# Patient Record
Sex: Male | Born: 1985 | Race: White | Hispanic: No | Marital: Married | Smoking: Never smoker
Health system: Southern US, Community
[De-identification: ages and names within clinical notes are randomized; demographics above are authoritative.]

## PROBLEM LIST (undated history)

## (undated) HISTORY — PX: NO PAST SURGERIES: SHX2092

---

## 2010-11-06 ENCOUNTER — Emergency Department: Payer: Self-pay | Admitting: Emergency Medicine

## 2010-12-27 ENCOUNTER — Emergency Department: Payer: Self-pay | Admitting: *Deleted

## 2015-02-18 ENCOUNTER — Encounter: Payer: Self-pay | Admitting: *Deleted

## 2015-02-18 ENCOUNTER — Ambulatory Visit
Admission: EM | Admit: 2015-02-18 | Discharge: 2015-02-18 | Disposition: A | Discharge status: Home / Self Care | Payer: BLUE CROSS/BLUE SHIELD | Attending: Family Medicine | Admitting: Family Medicine

## 2015-02-18 DIAGNOSIS — J0111 Acute recurrent frontal sinusitis: Secondary | ICD-10-CM

## 2015-02-18 DIAGNOSIS — B349 Viral infection, unspecified: Secondary | ICD-10-CM

## 2015-02-18 DIAGNOSIS — H6593 Unspecified nonsuppurative otitis media, bilateral: Secondary | ICD-10-CM | POA: Diagnosis not present

## 2015-02-18 LAB — RAPID INFLUENZA A&B ANTIGENS
Influenza A (ARMC): NOT DETECTED
Influenza B (ARMC): NOT DETECTED

## 2015-02-18 LAB — RAPID STREP SCREEN (MED CTR MEBANE ONLY): Streptococcus, Group A Screen (Direct): NEGATIVE

## 2015-02-18 MED ORDER — BENZONATATE 200 MG PO CAPS: 200.0000 mg | ORAL_CAPSULE | Freq: Three times a day (TID) | ORAL | Status: AC | PRN

## 2015-02-18 MED ORDER — IBUPROFEN 800 MG PO TABS: 800.0000 mg | ORAL_TABLET | Freq: Three times a day (TID) | ORAL | Status: AC

## 2015-02-18 MED ORDER — AMOXICILLIN-POT CLAVULANATE 875-125 MG PO TABS: 1.0000 | ORAL_TABLET | Freq: Two times a day (BID) | ORAL | Status: AC

## 2015-02-18 MED ORDER — ACETAMINOPHEN 500 MG PO TABS: 1000.0000 mg | ORAL_TABLET | Freq: Four times a day (QID) | ORAL | Status: AC | PRN

## 2015-02-18 MED ORDER — FLUTICASONE PROPIONATE 50 MCG/ACT NA SUSP: 1.0000 | Freq: Two times a day (BID) | NASAL | Status: AC

## 2015-02-18 MED ORDER — SALINE SPRAY 0.65 % NA SOLN: 2.0000 | NASAL | Status: AC

## 2015-02-18 NOTE — ED Notes (Signed)
Starting feeling bad yesterday, body aches, fever, cold and hot, went to work, had to leave early.  Cough, congestion, headache, body aches and fever.  Flu and strep pending.  States does have a history of strep throat.

## 2015-02-18 NOTE — Discharge Instructions (Signed)
Otitis Media With Effusion Otitis media with effusion is the presence of fluid in the middle ear. This is a common problem in children, which often follows ear infections. It may be present for weeks or longer after the infection. Unlike an acute ear infection, otitis media with effusion refers only to fluid behind the ear drum and not infection. Children with repeated ear and sinus infections and allergy problems are the most likely to get otitis media with effusion. CAUSES  The most frequent cause of the fluid buildup is dysfunction of the eustachian tubes. These are the tubes that drain fluid in the ears to the back of the nose (nasopharynx). SYMPTOMS   The main symptom of this condition is hearing loss. As a result, you or your child may:  Listen to the TV at a loud volume.  Not respond to questions.  Ask "what" often when spoken to.  Mistake or confuse one sound or word for another.  There may be a sensation of fullness or pressure but usually not pain. DIAGNOSIS   Your health care provider will diagnose this condition by examining you or your child's ears.  Your health care provider may test the pressure in you or your child's ear with a tympanometer.  A hearing test may be conducted if the problem persists. TREATMENT   Treatment depends on the duration and the effects of the effusion.  Antibiotics, decongestants, nose drops, and cortisone-type drugs (tablets or nasal spray) may not be helpful.  Children with persistent ear effusions may have delayed language or behavioral problems. Children at risk for developmental delays in hearing, learning, and speech may require referral to a specialist earlier than children not at risk.  You or your child's health care provider may suggest a referral to an ear, nose, and throat surgeon for treatment. The following may help restore normal hearing:  Drainage of fluid.  Placement of ear tubes (tympanostomy tubes).  Removal of  adenoids (adenoidectomy). HOME CARE INSTRUCTIONS   Avoid secondhand smoke.  Infants who are breastfed are less likely to have this condition.  Avoid feeding infants while they are lying flat.  Avoid known environmental allergens.  Avoid people who are sick. SEEK MEDICAL CARE IF:   Hearing is not better in 3 months.  Hearing is worse.  Ear pain.  Drainage from the ear.  Dizziness. MAKE SURE YOU:   Understand these instructions.  Will watch your condition.  Will get help right away if you are not doing well or get worse.   This information is not intended to replace advice given to you by your health care provider. Make sure you discuss any questions you have with your health care provider.   Document Released: 05/01/2004 Document Revised: 04/14/2014 Document Reviewed: 10/19/2012 Elsevier Interactive Patient Education 2016 [employer] Sinusitis, Adult Sinusitis is redness, soreness, and inflammation of the paranasal sinuses. Paranasal sinuses are air pockets within the bones of your face. They are located beneath your eyes, in the middle of your forehead, and above your eyes. In healthy paranasal sinuses, mucus is able to drain out, and air is able to circulate through them by way of your nose. However, when your paranasal sinuses are inflamed, mucus and air can become trapped. This can allow bacteria and other germs to grow and cause infection. Sinusitis can develop quickly and last only a short time (acute) or continue over a long period (chronic). Sinusitis that lasts for more than 12 weeks is considered chronic. CAUSES Causes of sinusitis  include:  Allergies.  Structural abnormalities, such as displacement of the cartilage that separates your nostrils (deviated septum), which can decrease the air flow through your nose and sinuses and affect sinus drainage.  Functional abnormalities, such as when the small hairs (cilia) that line your sinuses and help remove mucus do  not work properly or are not present. SIGNS AND SYMPTOMS Symptoms of acute and chronic sinusitis are the same. The primary symptoms are pain and pressure around the affected sinuses. Other symptoms include:  Upper toothache.  Earache.  Headache.  Bad breath.  Decreased sense of smell and taste.  A cough, which worsens when you are lying flat.  Fatigue.  Fever.  Thick drainage from your nose, which often is green and may contain pus (purulent).  Swelling and warmth over the affected sinuses. DIAGNOSIS Your health care provider will perform a physical exam. During your exam, your health care provider may perform any of the following to help determine if you have acute sinusitis or chronic sinusitis:  Look in your nose for signs of abnormal growths in your nostrils (nasal polyps).  Tap over the affected sinus to check for signs of infection.  View the inside of your sinuses using an imaging device that has a light attached (endoscope). If your health care provider suspects that you have chronic sinusitis, one or more of the following tests may be recommended:  Allergy tests.  Nasal culture. A sample of mucus is taken from your nose, sent to a lab, and screened for bacteria.  Nasal cytology. A sample of mucus is taken from your nose and examined by your health care provider to determine if your sinusitis is related to an allergy. TREATMENT Most cases of acute sinusitis are related to a viral infection and will resolve on their own within 10 days. Sometimes, medicines are prescribed to help relieve symptoms of both acute and chronic sinusitis. These may include pain medicines, decongestants, nasal steroid sprays, or saline sprays. However, for sinusitis related to a bacterial infection, your health care provider will prescribe antibiotic medicines. These are medicines that will help kill the bacteria causing the infection. Rarely, sinusitis is caused by a fungal infection. In  these cases, your health care provider will prescribe antifungal medicine. For some cases of chronic sinusitis, surgery is needed. Generally, these are cases in which sinusitis recurs more than 3 times per year, despite other treatments. HOME CARE INSTRUCTIONS  Drink plenty of water. Water helps thin the mucus so your sinuses can drain more easily.  Use a humidifier.  Inhale steam 3-4 times a day (for example, sit in the bathroom with the shower running).  Apply a warm, moist washcloth to your face 3-4 times a day, or as directed by your health care provider.  Use saline nasal sprays to help moisten and clean your sinuses.  Take medicines only as directed by your health care provider.  If you were prescribed either an antibiotic or antifungal medicine, finish it all even if you start to feel better. SEEK IMMEDIATE MEDICAL CARE IF:  You have increasing pain or severe headaches.  You have nausea, vomiting, or drowsiness.  You have swelling around your face.  You have vision problems.  You have a stiff neck.  You have difficulty breathing.   This information is not intended to replace advice given to you by your health care provider. Make sure you discuss any questions you have with your health care provider.   Document Released: 03/24/2005 Document Revised: 04/14/2014  Document Reviewed: 04/08/2011 Elsevier Interactive Patient Education 2016 [employer]. Viral Gastroenteritis Viral gastroenteritis is also known as stomach flu. This condition affects the stomach and intestinal tract. It can cause sudden diarrhea and vomiting. The illness typically lasts 3 to 8 days. Most people develop an immune response that eventually gets rid of the virus. While this natural response develops, the virus can make you quite ill. CAUSES  Many different viruses can cause gastroenteritis, such as rotavirus or noroviruses. You can catch one of these viruses by consuming contaminated food or water.  You may also catch a virus by sharing utensils or other personal items with an infected person or by touching a contaminated surface. SYMPTOMS  The most common symptoms are diarrhea and vomiting. These problems can cause a severe loss of body fluids (dehydration) and a body salt (electrolyte) imbalance. Other symptoms may include:  Fever.  Headache.  Fatigue.  Abdominal pain. DIAGNOSIS  Your caregiver can usually diagnose viral gastroenteritis based on your symptoms and a physical exam. A stool sample may also be taken to test for the presence of viruses or other infections. TREATMENT  This illness typically goes away on its own. Treatments are aimed at rehydration. The most serious cases of viral gastroenteritis involve vomiting so severely that you are not able to keep fluids down. In these cases, fluids must be given through an intravenous line (IV). HOME CARE INSTRUCTIONS   Drink enough fluids to keep your urine clear or pale yellow. Drink small amounts of fluids frequently and increase the amounts as tolerated.  Ask your caregiver for specific rehydration instructions.  Avoid:  Foods high in sugar.  Alcohol.  Carbonated drinks.  Tobacco.  Juice.  Caffeine drinks.  Extremely hot or cold fluids.  Fatty, greasy foods.  Too much intake of anything at one time.  Dairy products until 24 to 48 hours after diarrhea stops.  You may consume probiotics. Probiotics are active cultures of beneficial bacteria. They may lessen the amount and number of diarrheal stools in adults. Probiotics can be found in yogurt with active cultures and in supplements.  Wash your hands well to avoid spreading the virus.  Only take over-the-counter or prescription medicines for pain, discomfort, or fever as directed by your caregiver. Do not give aspirin to children. Antidiarrheal medicines are not recommended.  Ask your caregiver if you should continue to take your regular prescribed and  over-the-counter medicines.  Keep all follow-up appointments as directed by your caregiver. SEEK IMMEDIATE MEDICAL CARE IF:   You are unable to keep fluids down.  You do not urinate at least once every 6 to 8 hours.  You develop shortness of breath.  You notice blood in your stool or vomit. This may look like coffee grounds.  You have abdominal pain that increases or is concentrated in one small area (localized).  You have persistent vomiting or diarrhea.  You have a fever.  The patient is a child younger than 3 months, and he or she has a fever.  The patient is a child older than 3 months, and he or she has a fever and persistent symptoms.  The patient is a child older than 3 months, and he or she has a fever and symptoms suddenly get worse.  The patient is a baby, and he or she has no tears when crying. MAKE SURE YOU:   Understand these instructions.  Will watch your condition.  Will get help right away if you are not doing well or  get worse.   This information is not intended to replace advice given to you by your health care provider. Make sure you discuss any questions you have with your health care provider.   Document Released: 03/24/2005 Document Revised: 06/16/2011 Document Reviewed: 01/08/2011 Elsevier Interactive Patient Education 2016 [employer]. Viral Infections A virus is a type of germ. Viruses can cause:  Minor sore throats.  Aches and pains.  Headaches.  Runny nose.  Rashes.  Watery eyes.  Tiredness.  Coughs.  Loss of appetite.  Feeling sick to your stomach (nausea).  Throwing up (vomiting).  Watery poop (diarrhea). HOME CARE   Only take medicines as told by your doctor.  Drink enough water and fluids to keep your pee (urine) clear or pale yellow. Sports drinks are a good choice.  Get plenty of rest and eat healthy. Soups and broths with crackers or rice are fine. GET HELP RIGHT AWAY IF:   You have a very bad  headache.  You have shortness of breath.  You have chest pain or neck pain.  You have an unusual rash.  You cannot stop throwing up.  You have watery poop that does not stop.  You cannot keep fluids down.  You or your child has a temperature by mouth above 102 F (38.9 C), not controlled by medicine.  Your baby is older than 3 months with a rectal temperature of 102 F (38.9 C) or higher.  Your baby is 283 months old or younger with a rectal temperature of 100.4 F (38 C) or higher. MAKE SURE YOU:   Understand these instructions.  Will watch this condition.  Will get help right away if you are not doing well or get worse.   This information is not intended to replace advice given to you by your health care provider. Make sure you discuss any questions you have with your health care provider.   Document Released: 03/06/2008 Document Revised: 06/16/2011 Document Reviewed: 08/30/2014 Elsevier Interactive Patient Education [employer]2016 [employer]

## 2015-02-19 NOTE — ED Provider Notes (Addendum)
CSN: [id number]     Arrival date & time 02/18/15  1454 History   First MD Initiated Contact with Patient 02/18/15 1559     Chief Complaint  Patient presents with  . Influenza   (Consider location/radiation/quality/duration/timing/severity/associated sxs/prior Treatment) HPI Comments: Single caucasian male works at [employer] coworkers sick and diagnosed with flu.  Started yesterday with green cough productive, right frontal headache, sore throat, soft serve loose stool, fatigue, post nasal drip, sinus congestion  "I get this every couple of years goes straight to my chest pneumonia"  Patient is a 29 y.o. male presenting with flu symptoms. The history is provided by the patient.  Influenza Presenting symptoms: cough, diarrhea, fatigue, fever, headache, myalgias, rhinorrhea and sore throat   Presenting symptoms: no nausea, no shortness of breath and no vomiting   Cough:    Cough characteristics:  Productive   Sputum characteristics:  Green   Severity:  Moderate   Onset quality:  Sudden   Duration:  1 day   Timing:  Constant   Progression:  Unchanged   Chronicity:  New Diarrhea:    Quality:  Semi-solid   Number of occurrences:  2   Severity:  Mild   Duration:  1 day   Timing:  Intermittent   Progression:  Improving Fatigue:    Severity:  Severe   Duration:  1 day   Timing:  Constant   Progression:  Unchanged Fever:    Duration:  1 day   Timing:  Constant   Temp source:  Subjective Headaches:    Severity:  Severe   Onset quality:  Sudden   Duration:  1 day   Timing:  Constant   Progression:  Unchanged   Chronicity:  New Myalgias:    Location:  Generalized   Quality:  Aching   Severity:  Moderate   Onset quality:  Sudden   Duration:  1 day   Timing:  Constant   Progression:  Unchanged Rhinorrhea:    Quality:  Green   Severity:  Mild   Duration:  1 day   Timing:  Intermittent   Progression:  Unchanged Sore throat:    Severity:  Severe   Onset quality:  Sudden    Duration:  1 day   Timing:  Constant   Progression:  Unchanged Severity:  Moderate Onset quality:  Sudden Duration:  1 day Progression:  Worsening Chronicity:  New Relieved by:  Nothing Worsened by:  Movement Ineffective treatments:  Rest, hot fluids, drinking and certain positions Associated symptoms: chills, decreased appetite, decreased physical activity and nasal congestion   Associated symptoms: no ear pain, no mental status change, no neck stiffness and no witnessed syncope   Risk factors: not elderly, no diabetes problem, no heart disease, no kidney disease and no liver disease     History reviewed. No pertinent past medical history. History reviewed. No pertinent past surgical history. History reviewed. No pertinent family history. Social History  Substance Use Topics  . Smoking status: None  . Smokeless tobacco: None  . Alcohol Use: None    Review of Systems  Constitutional: Positive for fever, chills, activity change, appetite change, fatigue and decreased appetite. Negative for diaphoresis and unexpected weight change.  HENT: Positive for congestion, postnasal drip, rhinorrhea, sinus pressure and sore throat. Negative for dental problem, drooling, ear discharge, ear pain, facial swelling, hearing loss, mouth sores, nosebleeds, sneezing, tinnitus, trouble swallowing and voice change.   Eyes: Negative for photophobia, pain, discharge, redness, itching and visual disturbance.  Respiratory: Positive for cough. Negative for choking, chest tightness, shortness of breath, wheezing and stridor.   Cardiovascular: Negative for chest pain, palpitations and leg swelling.  Gastrointestinal: Positive for diarrhea. Negative for nausea, vomiting, abdominal pain, constipation, blood in stool and abdominal distention.  Endocrine: Negative for cold intolerance and heat intolerance.  Genitourinary: Negative for dysuria.  Musculoskeletal: Positive for myalgias. Negative for back pain, joint  swelling, arthralgias, gait problem, neck pain and neck stiffness.  Skin: Negative for color change, pallor, rash and wound.  Allergic/Immunologic: Positive for environmental allergies. Negative for food allergies and immunocompromised state.  Neurological: Positive for headaches. Negative for dizziness, tremors, seizures, syncope, facial asymmetry, speech difficulty, weakness, light-headedness and numbness.  Hematological: Negative for adenopathy. Does not bruise/bleed easily.  Psychiatric/Behavioral: Positive for sleep disturbance. Negative for behavioral problems, confusion and agitation.    Allergies  Review of patient's allergies indicates no known allergies.  Home Medications   Prior to Admission medications   Medication Sig Start Date End Date Taking? Authorizing Provider  acetaminophen (TYLENOL) 500 MG tablet Take 2 tablets (1,000 mg total) by mouth every 6 (six) hours as needed for mild pain, moderate pain, fever or headache. 02/18/15   Barbaraann Barthel, NP  amoxicillin-clavulanate (AUGMENTIN) 875-125 MG tablet Take 1 tablet by mouth every 12 (twelve) hours. 02/18/15   Barbaraann Barthel, NP  benzonatate (TESSALON) 200 MG capsule Take 1 capsule (200 mg total) by mouth 3 (three) times daily as needed for cough. 02/18/15 02/25/15  Barbaraann Barthel, NP  fluticasone (FLONASE) 50 MCG/ACT nasal spray Place 1 spray into both nostrils 2 (two) times daily. 02/18/15   Barbaraann Barthel, NP  ibuprofen (ADVIL,MOTRIN) 800 MG tablet Take 1 tablet (800 mg total) by mouth 3 (three) times daily. 02/18/15   Barbaraann Barthel, NP  sodium chloride (OCEAN) 0.65 % SOLN nasal spray Place 2 sprays into both nostrils every 2 (two) hours while awake. 02/18/15   Barbaraann Barthel, NP   Meds Ordered and Administered this Visit  Medications - No data to display  BP 130/78 mmHg  Pulse 86  Temp(Src) 98.9 F (37.2 C) (Oral)  Resp 20  Ht 5\' 11"  (1.803 m)  Wt 200 lb (90.719 kg)  BMI 27.91 kg/m2  SpO2  100% No data found.   Physical Exam  Constitutional: He is oriented to person, place, and time. Vital signs are normal. He appears well-developed and well-nourished. He is active and cooperative.  Non-toxic appearance. He does not have a sickly appearance. He appears ill. No distress.  HENT:  Head: Normocephalic and atraumatic.  Right Ear: Hearing, external ear and ear canal normal. A middle ear effusion is present.  Left Ear: Hearing, external ear and ear canal normal. A middle ear effusion is present.  Nose: Mucosal edema and rhinorrhea present. No nose lacerations, sinus tenderness, nasal deformity, septal deviation or nasal septal hematoma. No epistaxis.  No foreign bodies. Right sinus exhibits frontal sinus tenderness. Right sinus exhibits no maxillary sinus tenderness. Left sinus exhibits no maxillary sinus tenderness and no frontal sinus tenderness.  Mouth/Throat: Uvula is midline and mucous membranes are normal. Mucous membranes are not pale, not dry and not cyanotic. He does not have dentures. No oral lesions. No trismus in the jaw. Normal dentition. No dental abscesses, uvula swelling, lacerations or dental caries. Posterior oropharyngeal edema and posterior oropharyngeal erythema present. No oropharyngeal exudate or tonsillar abscesses.  Cobblestoning posterior pharynx; bilateral tonsils with edema/erythema 2+/4 bilateral TMs with air fluid level clear  occasional nonproductive cough; nasal turbinates with edema erythema yellow discharge  Eyes: Conjunctivae, EOM and lids are normal. Pupils are equal, round, and reactive to light. Right eye exhibits no chemosis, no discharge, no exudate and no hordeolum. No foreign body present in the right eye. Left eye exhibits no chemosis, no discharge, no exudate and no hordeolum. No foreign body present in the left eye. Right conjunctiva is not injected. Right conjunctiva has no hemorrhage. Left conjunctiva is not injected. Left conjunctiva has no  hemorrhage. No scleral icterus. Right eye exhibits normal extraocular motion and no nystagmus. Left eye exhibits normal extraocular motion and no nystagmus. Right pupil is round and reactive. Left pupil is round and reactive. Pupils are equal.  Neck: Trachea normal and normal range of motion. Neck supple. No tracheal tenderness, no spinous process tenderness and no muscular tenderness present. No rigidity. No tracheal deviation, no edema, no erythema and normal range of motion present. No thyroid mass and no thyromegaly present.  Cardiovascular: Normal rate, regular rhythm, S1 normal, S2 normal, normal heart sounds and intact distal pulses.  PMI is not displaced.  Exam reveals no gallop and no friction rub.   No murmur heard. Pulses:      Radial pulses are 2+ on the right side, and 2+ on the left side.  Pulmonary/Chest: Effort normal and breath sounds normal. No accessory muscle usage or stridor. No respiratory distress. He has no decreased breath sounds. He has no wheezes. He has no rhonchi. He has no rales. He exhibits no tenderness.  egophany negative all lung fields  Abdominal: Soft. Bowel sounds are normal. He exhibits no shifting dullness, no distension, no pulsatile liver, no fluid wave, no abdominal bruit, no ascites, no pulsatile midline mass and no mass. There is no hepatosplenomegaly. There is no tenderness. There is no rigidity, no rebound, no guarding, no CVA tenderness, no tenderness at McBurney's point and negative Murphy's sign. Hernia confirmed negative in the ventral area.  Dull to percussion x 4 quads  Musculoskeletal: Normal range of motion. He exhibits no edema or tenderness.       Right shoulder: Normal.       Left shoulder: Normal.       Right hip: Normal.       Left hip: Normal.       Right knee: Normal.       Left knee: Normal.       Cervical back: Normal.       Thoracic back: Normal.       Lumbar back: Normal.       Right hand: Normal.       Left hand: Normal.   Lymphadenopathy:       Head (right side): No submental, no submandibular, no tonsillar, no preauricular, no posterior auricular and no occipital adenopathy present.       Head (left side): No submental, no submandibular, no tonsillar, no preauricular, no posterior auricular and no occipital adenopathy present.    He has no cervical adenopathy.       Right cervical: No superficial cervical, no deep cervical and no posterior cervical adenopathy present.      Left cervical: No superficial cervical, no deep cervical and no posterior cervical adenopathy present.  Neurological: He is alert and oriented to person, place, and time. He displays no atrophy and no tremor. No cranial nerve deficit or sensory deficit. He exhibits normal muscle tone. He displays no seizure activity. Coordination and gait normal. GCS eye subscore is 4. GCS  verbal subscore is 5. GCS motor subscore is 6.  Skin: Skin is warm, dry and intact. No abrasion, no bruising, no burn, no ecchymosis, no laceration, no lesion, no petechiae and no rash noted. He is not diaphoretic. No cyanosis or erythema. No pallor. Nails show no clubbing.  Psychiatric: He has a normal mood and affect. His speech is normal and behavior is normal. Judgment and thought content normal. Cognition and memory are normal.  Nursing note and vitals reviewed.   ED Course  Procedures (including critical care time)  Labs Review Labs Reviewed  RAPID INFLUENZA A&B ANTIGENS ([facility] ONLY)  RAPID STREP SCREEN (NOT AT [facility])  CULTURE, GROUP A STREP ([facility] ONLY)    Imaging Review No results found.  1630 patient notified rapid flu and strep negative.  Throat culture typically available 48 hours will call once results available. Tolerated cup of water without difficulty.  Patient verbalized understanding of information/instructions, agreed with plan of care and had no further questions at this time.  MDM   1. Viral illness   2. Otitis media with effusion, bilateral   3.  Acute recurrent frontal sinusitis    Supportive treatment.   No evidence of invasive bacterial infection, non toxic and well hydrated.  This is most likely self limiting viral infection.  I do not see where any further testing or imaging is necessary at this time.   I will suggest supportive care, rest, good hygiene and encourage the patient to take adequate fluids.  The patient is to return to clinic or EMERGENCY ROOM if symptoms worsen or change significantly e.g. ear pain, fever, purulent discharge from ears or bleeding.  Exitcare handout on otitis media with effusion given to patient.  Patient verbalized agreement and understanding of treatment plan.    Patient notified rapid strep negative.  Work excuse x 72 hours.  Suspect Viral illness: no evidence of invasive bacterial infection, non toxic and well hydrated.  This is most likely self limiting viral infection.  I do not see where any further testing or imaging is necessary at this time.   I will suggest supportive care, rest, good hygiene and encourage the patient to take adequate fluids.  Does not require work excuse.  Notified patient staff will call with culture results once available next 48+ hours.  nasal saline 1-2 sprays each nostril prn q2h, motrin 800mg  po TID prn.  Tessalon pearles 200mg  po TID prn cough.  Discussed honey with lemon and salt water gargles for comfort also.  The patient is to return to clinic or EMERGENCY ROOM if symptoms worsen or change significantly e.g. fever, lethargy, SOB, wheezing.  Exitcare handout on viral illness given to patient.  Patient verbalized agreement and understanding of treatment plan.    flonase 1 spray each nostril BId and saline 2 sprays each nostril q2h while awake.  Motrin 800mg  po TID prn pain.  If no relief 48-72 hours with flonase start augmentin 875mg  po BID x 10 days.  May also use tylenol 1000mg  po QID prn.  No evidence of systemic bacterial infection, non toxic and well hydrated.  I do not see  where any further testing or imaging is necessary at this time.   I will suggest supportive care, rest, good hygiene and encourage the patient to take adequate fluids.  The patient is to return to clinic or EMERGENCY ROOM if symptoms worsen or change significantly.  Exitcare handout on sinusitis given to patient.  Patient verbalized agreement and understanding of treatment plan  and had no further questions at this time.   P2:  Hand washing and cover cough   Barbaraann Barthelina A Lizbett Garciagarcia, NP 02/19/15 1353  20 Feb 2015 patient contacted clinic stated medicine not helping his cough.  Reviewed [place] Controlled substances website 1 Rx noted in past year  [phone]  Patient requesting refill of controlled cough medicine as worked well for him last time.  Discussed with patient he would need to pick up Rx from clinic front desk prior to closing at 2000 today.  Patient also notified throat culture normal/negative for atypical strep.  Patient verbalized understanding of information/instructions, agreed with plan of care and had no further questions at this time. 24/2016 04/30/2014 HYDROCODONE- CHLORPHEN ER SUSP [id number] [phone] [id number] PURSEL BRANDI B D.[place]. [place], [place] [id number] [facility] [place], [place] Bud[place], Mc M 12-May-1985 [address] [place], [place] [id number] 04 0   Barbaraann Barthelina A Ozzy Bohlken, NP 02/20/15 1131

## 2015-02-20 LAB — CULTURE, GROUP A STREP (THRC)

## 2015-02-20 MED ORDER — HYDROCOD POLST-CPM POLST ER 10-8 MG/5ML PO SUER: 5.0000 mL | Freq: Every evening | ORAL | Status: AC | PRN

## 2015-08-22 ENCOUNTER — Emergency Department: Payer: BLUE CROSS/BLUE SHIELD

## 2015-08-22 ENCOUNTER — Emergency Department
Admission: EM | Admit: 2015-08-22 | Discharge: 2015-08-22 | Disposition: A | Discharge status: Home / Self Care | Payer: BLUE CROSS/BLUE SHIELD | Attending: Student | Admitting: Student

## 2015-08-22 ENCOUNTER — Encounter: Payer: Self-pay | Admitting: Urgent Care

## 2015-08-22 DIAGNOSIS — Y9364 Activity, baseball: Secondary | ICD-10-CM | POA: Insufficient documentation

## 2015-08-22 DIAGNOSIS — Y929 Unspecified place or not applicable: Secondary | ICD-10-CM | POA: Insufficient documentation

## 2015-08-22 DIAGNOSIS — W010XXA Fall on same level from slipping, tripping and stumbling without subsequent striking against object, initial encounter: Secondary | ICD-10-CM | POA: Insufficient documentation

## 2015-08-22 DIAGNOSIS — Y999 Unspecified external cause status: Secondary | ICD-10-CM | POA: Insufficient documentation

## 2015-08-22 DIAGNOSIS — S42002A Fracture of unspecified part of left clavicle, initial encounter for closed fracture: Secondary | ICD-10-CM

## 2015-08-22 DIAGNOSIS — S4992XA Unspecified injury of left shoulder and upper arm, initial encounter: Secondary | ICD-10-CM | POA: Diagnosis present

## 2015-08-22 DIAGNOSIS — Z79899 Other long term (current) drug therapy: Secondary | ICD-10-CM | POA: Insufficient documentation

## 2015-08-22 MED ORDER — OXYCODONE-ACETAMINOPHEN 5-325 MG PO TABS: 1.0000 | ORAL_TABLET | ORAL | Status: AC | PRN

## 2015-08-22 MED ORDER — OXYCODONE-ACETAMINOPHEN 5-325 MG PO TABS
1.0000 | ORAL_TABLET | Freq: Once | ORAL | Status: AC
  Administered 2015-08-22: 1 via ORAL
  Filled 2015-08-22: qty 1

## 2015-08-22 NOTE — ED Provider Notes (Signed)
[facility] Emergency Department Provider Note  ____________________________________________  Time seen: Approximately 10:26 PM  I have reviewed the triage vital signs and the nursing notes.   HISTORY  Chief Complaint Shoulder Injury    HPI Reginald Zamora is a 30 y.o. male resents for evaluation of left clavicle pain. Patient states he's. All tripped and landed right on his left shoulder. Complains to the collarbone area.   History reviewed. No pertinent past medical history.  There are no active problems to display for this patient.   History reviewed. No pertinent past surgical history.  Current Outpatient Rx  Name  Route  Sig  Dispense  Refill  . acetaminophen (TYLENOL) 500 MG tablet   Oral   Take 2 tablets (1,000 mg total) by mouth every 6 (six) hours as needed for mild pain, moderate pain, fever or headache.   30 tablet   0   . amoxicillin-clavulanate (AUGMENTIN) 875-125 MG tablet   Oral   Take 1 tablet by mouth every 12 (twelve) hours.   20 tablet   0   . fluticasone (FLONASE) 50 MCG/ACT nasal spray   Each Nare   Place 1 spray into both nostrils 2 (two) times daily.   16 g   0   . ibuprofen (ADVIL,MOTRIN) 800 MG tablet   Oral   Take 1 tablet (800 mg total) by mouth 3 (three) times daily.   30 tablet   0   . oxyCODONE-acetaminophen (ROXICET) 5-325 MG tablet   Oral   Take 1-2 tablets by mouth every 4 (four) hours as needed for severe pain.   15 tablet   0   . sodium chloride (OCEAN) 0.65 % SOLN nasal spray   Each Nare   Place 2 sprays into both nostrils every 2 (two) hours while awake.      0     Allergies Review of patient's allergies indicates no known allergies.  No family history on file.  Social History Social History  Substance Use Topics  . Smoking status: Never Smoker   . Smokeless tobacco: None  . Alcohol Use: Yes    Review of Systems Constitutional: No fever/chills Musculoskeletal: Positive for left  clavicle pain. Skin: Negative for rash. Neurological: Negative for headaches, focal weakness or numbness.  10-point ROS otherwise negative.  ____________________________________________   PHYSICAL EXAM:  VITAL SIGNS: ED Triage Vitals  Enc Vitals Group     BP 08/22/15 2134 145/81 mmHg     Pulse Rate 08/22/15 2134 95     Resp 08/22/15 2134 18     Temp 08/22/15 2134 98.5 F (36.9 C)     Temp Source 08/22/15 2134 Oral     SpO2 08/22/15 2134 100 %     Weight 08/22/15 2134 215 lb (97.523 kg)     Height 08/22/15 2134  (1.803 m)     Head Cir --      Peak Flow --      Pain Score 08/22/15 2135 8     Pain Loc --      Pain Edu? --      Excl. in GC? --     Constitutional: Alert and oriented. Well appearing and in no acute distress. Musculoskeletal: Point tenderness obvious for a left clavicle. Neurologic:  Normal speech and language. No gross focal neurologic deficits are appreciated. No gait instability. Skin:  Skin is warm, dry and intact. No rash noted. Psychiatric: Mood and affect are normal. Speech and behavior are normal.  ____________________________________________   LABS (all labs ordered are listed, but only abnormal results are displayed)  Labs Reviewed - No data to display ____________________________________________   RADIOLOGY  IMPRESSION: Mildly displaced fracture at distal third LEFT clavicle. ____________________________________________   PROCEDURES  Procedure(s) performed: None  Critical Care performed: No  ____________________________________________   INITIAL IMPRESSION / ASSESSMENT AND PLAN / ED COURSE  Pertinent labs & imaging results that were available during my care of the patient were reviewed by me and considered in my medical decision making (see chart for details).  Acute left clavicle fracture. Rx given for Percocet a clavicle splint was placed and patient was given a sling for comfort and follow up with orthopedics as needed.  He voices no other emergency medical complaints at this time. ____________________________________________   FINAL CLINICAL IMPRESSION(S) / ED DIAGNOSES  Final diagnoses:  Clavicle fracture, left, closed, initial encounter     This chart was dictated using voice recognition software/Dragon. Despite best efforts to proofread, errors can occur which can change the meaning. Any change was purely unintentional.   Evangeline Dakinharles M Beers, PA-C 08/22/15 [id number]  Gayla DossEryka A Gayle, MD 08/22/15 [phone]

## 2015-08-22 NOTE — Discharge Instructions (Signed)
Clavicle Fracture  The clavicle, also called the collarbone, is the long bone that connects your shoulder to your rib cage. You can feel your collarbone at the top of your shoulders and rib cage. A clavicle fracture is a broken clavicle. It is a common injury that can happen at any age.   CAUSES  Common causes of a clavicle fracture include:  · A direct blow to your shoulder.  · A car accident.  · A fall, especially if you try to break your fall with an outstretched arm.  RISK FACTORS  You may be at increased risk if:  · You are younger than 25 years or older than 75 years. Most clavicle fractures happen to people who are younger than 25 years.  · You are a male.  · You play contact sports.  SIGNS AND SYMPTOMS  A fractured clavicle is painful. It also makes it hard to move your arm. Other signs and symptoms may include:  · A shoulder that drops downward and forward.  · Pain when trying to lift your shoulder.  · Bruising, swelling, and tenderness over your clavicle.  · A grinding noise when you try to move your shoulder.  · A bump over your clavicle.  DIAGNOSIS  Your health care provider can usually diagnose a clavicle fracture by asking about your injury and examining your shoulder and clavicle. He or she may take an X-ray to determine the position of your clavicle.  TREATMENT  Treatment depends on the position of your clavicle after the fracture:  · If the broken ends of the bone are not out of place, your health care provider may put your arm in a sling or wrap a support bandage around your chest (figure-of-eight wrap).  · If the broken ends of the bone are out of place, you may need surgery. Surgery may involve placing screws, pins, or plates to keep your clavicle stable while it heals. Healing may take about 3 months.  When your health care provider thinks your fracture has healed enough, you may have to do physical therapy to regain normal movement and build up your arm strength.  HOME CARE INSTRUCTIONS    · Apply ice to the injured area:    Put ice in a plastic bag.    Place a towel between your skin and the bag.    Leave the ice on for 20 minutes, 2-3 times a day.  · If you have a wrap or splint:    Wear it all the time, and remove it only to take a bath or shower.    When you bathe or shower, keep your shoulder in the same position as when the sling or wrap is on.    Do not lift your arm.  · If you have a figure-of-eight wrap:    Another person must tighten it every day.    It should be tight enough to hold your shoulders back.    Allow enough room to place your index finger between your body and the strap.    Loosen the wrap immediately if you feel numbness or tingling in your hands.  · Only take medicines as directed by your health care provider.  · Avoid activities that make the injury or pain worse for 4-6 weeks after surgery.  · Keep all follow-up appointments.  SEEK MEDICAL CARE IF:   Your medicine is not helping to relieve pain and swelling.  SEEK IMMEDIATE MEDICAL CARE IF:   Your arm is   numb, cold, or pale, even when the splint is loose.  MAKE SURE YOU:   · Understand these instructions.  · Will watch your condition.  · Will get help right away if you are not doing well or get worse.     This information is not intended to replace advice given to you by your health care provider. Make sure you discuss any questions you have with your health care provider.     Document Released: 01/01/2005 Document Revised: 03/29/2013 Document Reviewed: 02/14/2013  Elsevier Interactive Patient Education ©2016 [employer]

## 2015-08-22 NOTE — ED Notes (Signed)

## 2015-08-22 NOTE — ED Notes (Signed)
Patient presents with c/o LEFT shoulder pain. He reports that he was playing softball, tripped and landed on shoulder. (+) P/M/S noted distal to injury. Hand warm and dry.

## 2016-03-24 ENCOUNTER — Ambulatory Visit (INDEPENDENT_AMBULATORY_CARE_PROVIDER_SITE_OTHER): Payer: BLUE CROSS/BLUE SHIELD

## 2016-03-24 ENCOUNTER — Ambulatory Visit
Admission: EM | Admit: 2016-03-24 | Discharge: 2016-03-24 | Disposition: A | Discharge status: Home / Self Care | Payer: BLUE CROSS/BLUE SHIELD | Attending: Family Medicine | Admitting: Family Medicine

## 2016-03-24 DIAGNOSIS — R6889 Other general symptoms and signs: Secondary | ICD-10-CM

## 2016-03-24 LAB — RAPID INFLUENZA A&B ANTIGENS (ARMC ONLY)
INFLUENZA A (ARMC): NEGATIVE
INFLUENZA B (ARMC): NEGATIVE

## 2016-03-24 LAB — RAPID STREP SCREEN (MED CTR MEBANE ONLY): Streptococcus, Group A Screen (Direct): NEGATIVE

## 2016-03-24 MED ORDER — IBUPROFEN 800 MG PO TABS
800.0000 mg | ORAL_TABLET | Freq: Once | ORAL | Status: AC
  Administered 2016-03-24: 800 mg via ORAL

## 2016-03-24 MED ORDER — IPRATROPIUM-ALBUTEROL 0.5-2.5 (3) MG/3ML IN SOLN
3.0000 mL | Freq: Once | RESPIRATORY_TRACT | Status: AC
  Administered 2016-03-24: 3 mL via RESPIRATORY_TRACT

## 2016-03-24 MED ORDER — AZITHROMYCIN 250 MG PO TABS: ORAL_TABLET | ORAL | 0 refills | Status: AC

## 2016-03-24 MED ORDER — ALBUTEROL SULFATE HFA 108 (90 BASE) MCG/ACT IN AERS: 2.0000 | INHALATION_SPRAY | RESPIRATORY_TRACT | 0 refills | Status: AC | PRN

## 2016-03-24 MED ORDER — BENZONATATE 100 MG PO CAPS: 100.0000 mg | ORAL_CAPSULE | Freq: Three times a day (TID) | ORAL | 0 refills | Status: AC | PRN

## 2016-03-24 MED ORDER — HYDROCOD POLST-CPM POLST ER 10-8 MG/5ML PO SUER: 5.0000 mL | Freq: Every evening | ORAL | 0 refills | Status: AC | PRN

## 2016-03-24 NOTE — ED Provider Notes (Signed)
[facility] ____________________________________________  Time seen: Approximately 8:23 PM  I have reviewed the triage vital signs and the nursing notes.   HISTORY  Chief Complaint Cough   HPI Reginald Zamora is a 30 y.o. male presenting for the complaints of 4 days of runny nose, nasal congestion, cough and chest congestion. Patient reports feeling tired and intermittent fevers. Patient reports fever started at initial symptom onset, resolved for 2 days and then returned today. States has intermittently taken over-the-counter ibuprofen and Tylenol. Has not taken any medication since this morning. Reports multiple sick contacts at work. Denies home sick contacts. Reports overall continues to eat and drink well. States intermittent wheezing with cough.  Denies chest pain, shortness of breath, abdominal pain, dysuria, extremity pain, extremity swelling, rash. Denies recent sickness or recent antibiotic use.   History reviewed. No pertinent past medical history.  There are no active problems to display for this patient.   Past Surgical History:  Procedure Laterality Date  . NO PAST SURGERIES      Current Outpatient Rx  . Order #: [id number] Class: Normal  . Order #: [id number] Class: Normal  . Order #: [id number] Class: Print  . Order #: [id number] Class: Normal  . Order #: [id number] Class: Normal  . Order #: [id number] Class: Print  . Order #: [id number] Class: Normal  . Order #: [id number] Class: Normal  . Order #: [id number] Class: Print  . Order #: [id number] Class: OTC    No current facility-administered medications for this encounter.   Current Outpatient Prescriptions:  .  acetaminophen (TYLENOL) 500 MG tablet, Take 2 tablets (1,000 mg total) by mouth every 6 (six) hours as needed for mild pain, moderate pain, fever or headache., Disp: 30 tablet, Rfl: 0 .  albuterol (PROVENTIL HFA;VENTOLIN HFA) 108 (90 Base) MCG/ACT inhaler, Inhale 2 puffs into the lungs every 4 (four) hours  as needed., Disp: 1 Inhaler, Rfl: 0 .  amoxicillin-clavulanate (AUGMENTIN) 875-125 MG tablet, Take 1 tablet by mouth every 12 (twelve) hours., Disp: 20 tablet, Rfl: 0 .  azithromycin (ZITHROMAX Z-PAK) 250 MG tablet, Take 2 tablets (500 mg) on  Day 1,  followed by 1 tablet (250 mg) once daily on Days 2 through 5., Disp: 6 each, Rfl: 0 .  benzonatate (TESSALON PERLES) 100 MG capsule, Take 1 capsule (100 mg total) by mouth 3 (three) times daily as needed., Disp: 15 capsule, Rfl: 0 .  chlorpheniramine-HYDROcodone (TUSSIONEX PENNKINETIC ER) 10-8 MG/5ML SUER, Take 5 mLs by mouth at bedtime as needed. do not drive or operate machinery while taking as can cause drowsiness., Disp: 75 mL, Rfl: 0 .  fluticasone (FLONASE) 50 MCG/ACT nasal spray, Place 1 spray into both nostrils 2 (two) times daily., Disp: 16 g, Rfl: 0 .  ibuprofen (ADVIL,MOTRIN) 800 MG tablet, Take 1 tablet (800 mg total) by mouth 3 (three) times daily., Disp: 30 tablet, Rfl: 0 .  oxyCODONE-acetaminophen (ROXICET) 5-325 MG tablet, Take 1-2 tablets by mouth every 4 (four) hours as needed for severe pain., Disp: 15 tablet, Rfl: 0 .  sodium chloride (OCEAN) 0.65 % SOLN nasal spray, Place 2 sprays into both nostrils every 2 (two) hours while awake., Disp: , Rfl: 0  Allergies Patient has no known allergies.  History reviewed. No pertinent family history.  Social History Social History  Substance Use Topics  . Smoking status: Never Smoker  . Smokeless tobacco: Never Used  . Alcohol use Yes    Review of Systems Constitutional:As above. Eyes: No visual changes. ENT: States mild sore throat. Cardiovascular: Denies chest pain.  Respiratory: Denies shortness of breath. Gastrointestinal: No abdominal pain.  No nausea, no vomiting.  No diarrhea.  No constipation. Genitourinary: Negative for dysuria. Musculoskeletal: Negative for back pain. Skin: Negative for rash. Neurological: Negative for headaches, focal weakness or numbness.  10-point  ROS otherwise negative.  ____________________________________________   PHYSICAL EXAM:  VITAL SIGNS: ED Triage Vitals  Enc Vitals Group     BP 03/24/16 1752 131/85     Pulse Rate 03/24/16 1752 (!) 130     Resp 03/24/16 1752 20     Temp 03/24/16 1752 (!) 100.4 F (38 C)     Temp Source 03/24/16 1752 Oral     SpO2 03/24/16 1752 96 %     Weight 03/24/16 1750 215 lb (97.5 kg)     Height 03/24/16 1750 6' (1.829 m)     Head Circumference --      Peak Flow --      Pain Score 03/24/16 1753 8     Pain Loc --      Pain Edu? --      Excl. in GC? --    Constitutional: Alert and oriented. Well appearing and in no acute distress. Eyes: Conjunctivae are normal. PERRL. EOMI. Head: Atraumatic. No sinus tenderness to palpation. No swelling. No erythema.  Ears: no erythema, normal TMs bilaterally.   Nose:Nasal congestion with clear rhinorrhea  Mouth/Throat: Mucous membranes are moist. Mild pharyngeal erythema. No tonsillar swelling or exudate.  Neck: No stridor.  No cervical spine tenderness to palpation. Hematological/Lymphatic/Immunilogical: No cervical lymphadenopathy. Cardiovascular: Mild tachycardia, regular rhythm. Grossly normal heart sounds.  Good peripheral circulation. Respiratory: Normal respiratory effort.  No retractions.  Good air movement. Mild bilateral rhonchi and mild scattered wheezes. No focal area of consolidation auscultated.  Gastrointestinal: Soft and nontender.  No CVA tenderness. No hepatosplenomegaly palpated. Musculoskeletal: Ambulatory with steady gait. No cervical, thoracic or lumbar tenderness to palpation. Neurologic:  Normal speech and language. No gross focal neurologic deficits are appreciated. No gait instability. Skin:  Skin is warm, dry and intact. No rash noted. Psychiatric: Mood and affect are normal. Speech and behavior are normal. ___________________________________________   LABS (all labs ordered are listed, but only abnormal results are  displayed)  Labs Reviewed  RAPID INFLUENZA A&B ANTIGENS ([facility] ONLY)  RAPID STREP SCREEN (NOT AT [facility])  CULTURE, GROUP A STREP [facility])    RADIOLOGY  Dg Chest 2 View  Result Date: 03/24/2016 CLINICAL DATA:  Fever and productive cough with some dyspnea for 4 days. EXAM: CHEST  2 VIEW COMPARISON:  None. FINDINGS: The heart size and mediastinal contours are within normal limits. Both lungs are clear. The visualized skeletal structures are unremarkable. IMPRESSION: No active cardiopulmonary disease. Electronically Signed   By: Ellery Plunkaniel R Mitchell M.D.   On: 03/24/2016 19:05   ____________________________________________   PROCEDURES Procedures    INITIAL IMPRESSION / ASSESSMENT AND PLAN / ED COURSE  Pertinent labs & imaging results that were available during my care of the patient were reviewed by me and considered in my medical decision making (see chart for details).  Well-appearing patient. No acute distress. Suspect influenza-like illness. Quick strep negative, will culture. Influenza negative. However discussed in detail with patient suspect influenza initiating symptoms. However with bilateral rhonchi and scattered wheezes noted, discussed with patient evaluation of chest x-ray is concerning for secondary infection. One dose of 800 mg ibuprofen given once in urgent care. DuoNeb given.  After DuoNeb, wheezes fully resolved, and patient reports he is feeling much better. Chest x-ray  per radiologist, no active cardiopulmonary disease. As timeframe of symptoms, will defer Tamiflu, and patient states he does not want Tamiflu anyways. As concerned with secondary infection, will initiate azithromycin. Also treat when necessary Tessalon Perles, and when necessary  albuterol inhaler and when necessary Tussionex at night. [place] controlled substance database reviewed, and last controlled substances prescription received was 11/07/2015 of tramadol. Encouraged rest, fluids and supportive care.  Discussed indication, risks and benefits of medications with patient.  Discussed follow up with Primary care physician this week. Discussed follow up and return parameters including no resolution or any worsening concerns. Patient verbalized understanding and agreed to plan.   ____________________________________________   FINAL CLINICAL IMPRESSION(S) / ED DIAGNOSES  Final diagnoses:  Flu-like symptoms     Discharge Medication List as of 03/24/2016  7:26 PM    START taking these medications   Details  albuterol (PROVENTIL HFA;VENTOLIN HFA) 108 (90 Base) MCG/ACT inhaler Inhale 2 puffs into the lungs every 4 (four) hours as needed., Starting Mon 03/24/2016, Normal    azithromycin (ZITHROMAX Z-PAK) 250 MG tablet Take 2 tablets (500 mg) on  Day 1,  followed by 1 tablet (250 mg) once daily on Days 2 through 5., Normal    benzonatate (TESSALON PERLES) 100 MG capsule Take 1 capsule (100 mg total) by mouth 3 (three) times daily as needed., Starting Mon 03/24/2016, Normal    chlorpheniramine-HYDROcodone (TUSSIONEX PENNKINETIC ER) 10-8 MG/5ML SUER Take 5 mLs by mouth at bedtime as needed. do not drive or operate machinery while taking as can cause drowsiness., Starting Mon 03/24/2016, Print        Note: This dictation was prepared with Dragon dictation along with smaller phrase technology. Any transcriptional errors that result from this process are unintentional.    Clinical Course       Renford Dills, NP 03/24/16 2035

## 2016-03-24 NOTE — Discharge Instructions (Signed)
Take medication as prescribed. Rest. Drink plenty of fluids.  ° °Follow up with your primary care physician this week as needed. Return to Urgent care for new or worsening concerns.  ° °

## 2016-03-24 NOTE — ED Triage Notes (Signed)
Patient complains of cough and congestion with fatigue and fevers. Patient states that symptom started on Friday. Patient states that he improved on Sunday and worsened again yesterday. Patient has been wheezing as well.

## 2016-03-27 LAB — CULTURE, GROUP A STREP (THRC)

## 2017-05-09 IMAGING — CR DG SHOULDER 2+V*L*
1 series · 3 of 3 positions shown · non-contrast
Comparison: None

CLINICAL DATA: LEFT shoulder pain, tripped and landed on LEFT
shoulder playing softball tonight, sports injury, initial encounter

EXAM:
LEFT SHOULDER - 2+ VIEW

[Series 1: dg shoulder left · 0.14mm/px · 3 of 3 slices shown]
[im 1/3]
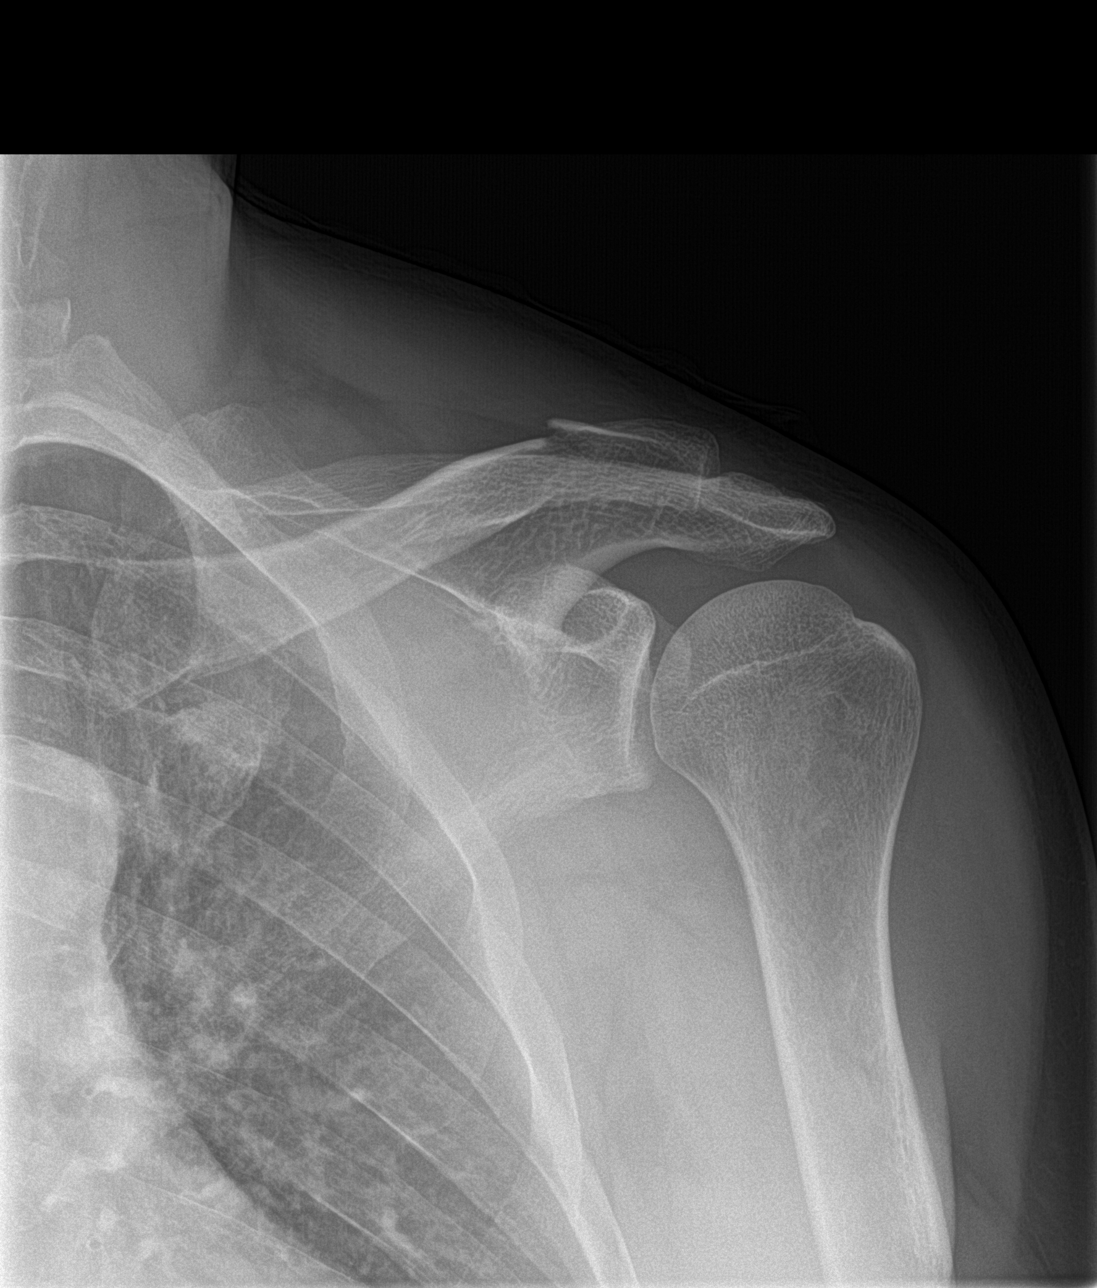
[im 2/3]
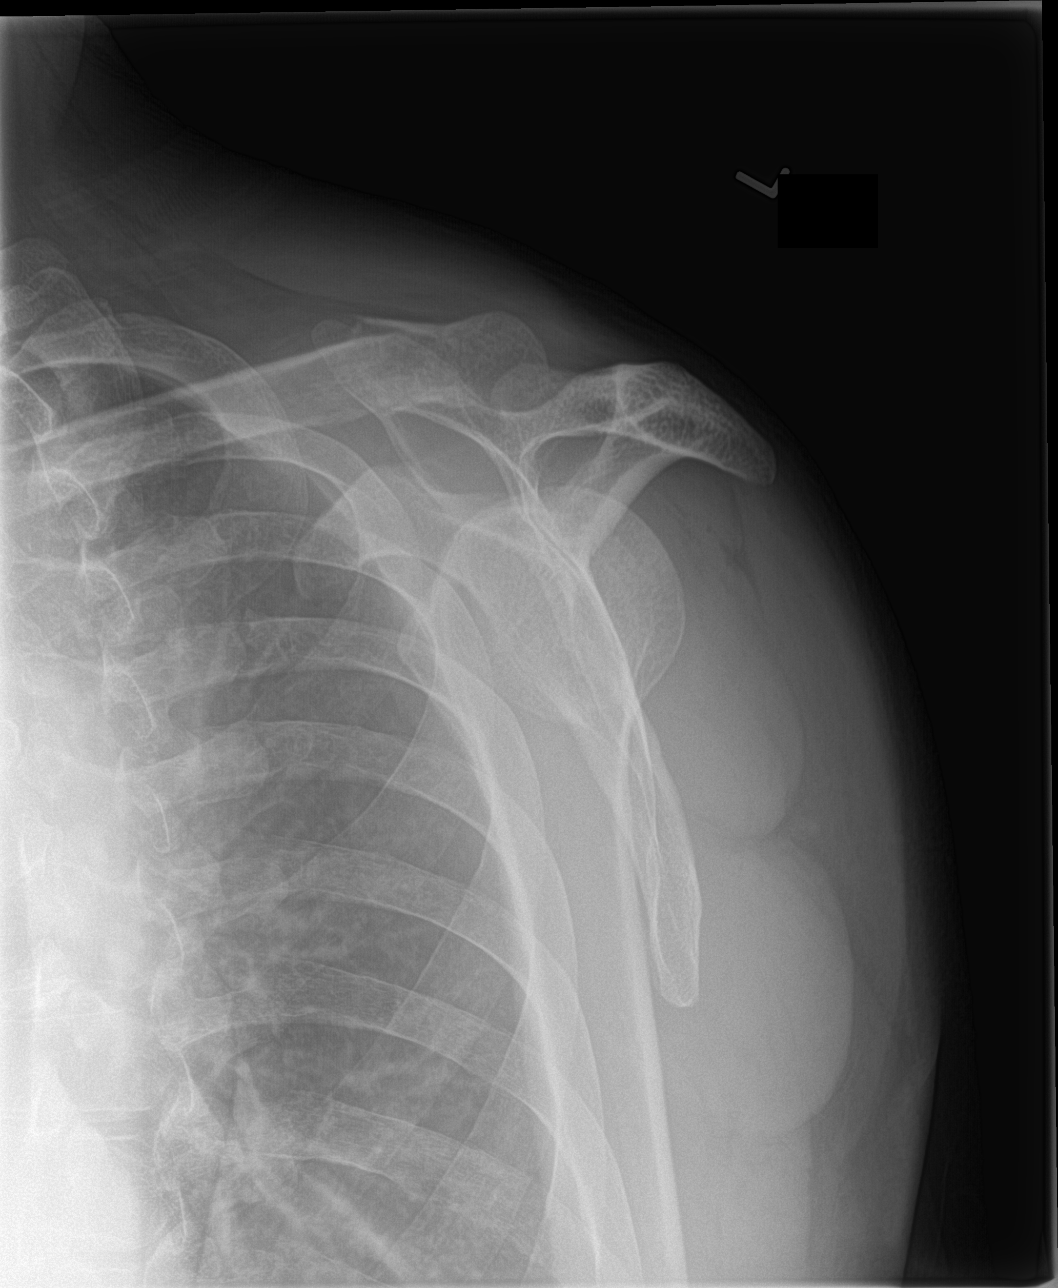
[im 3/3]
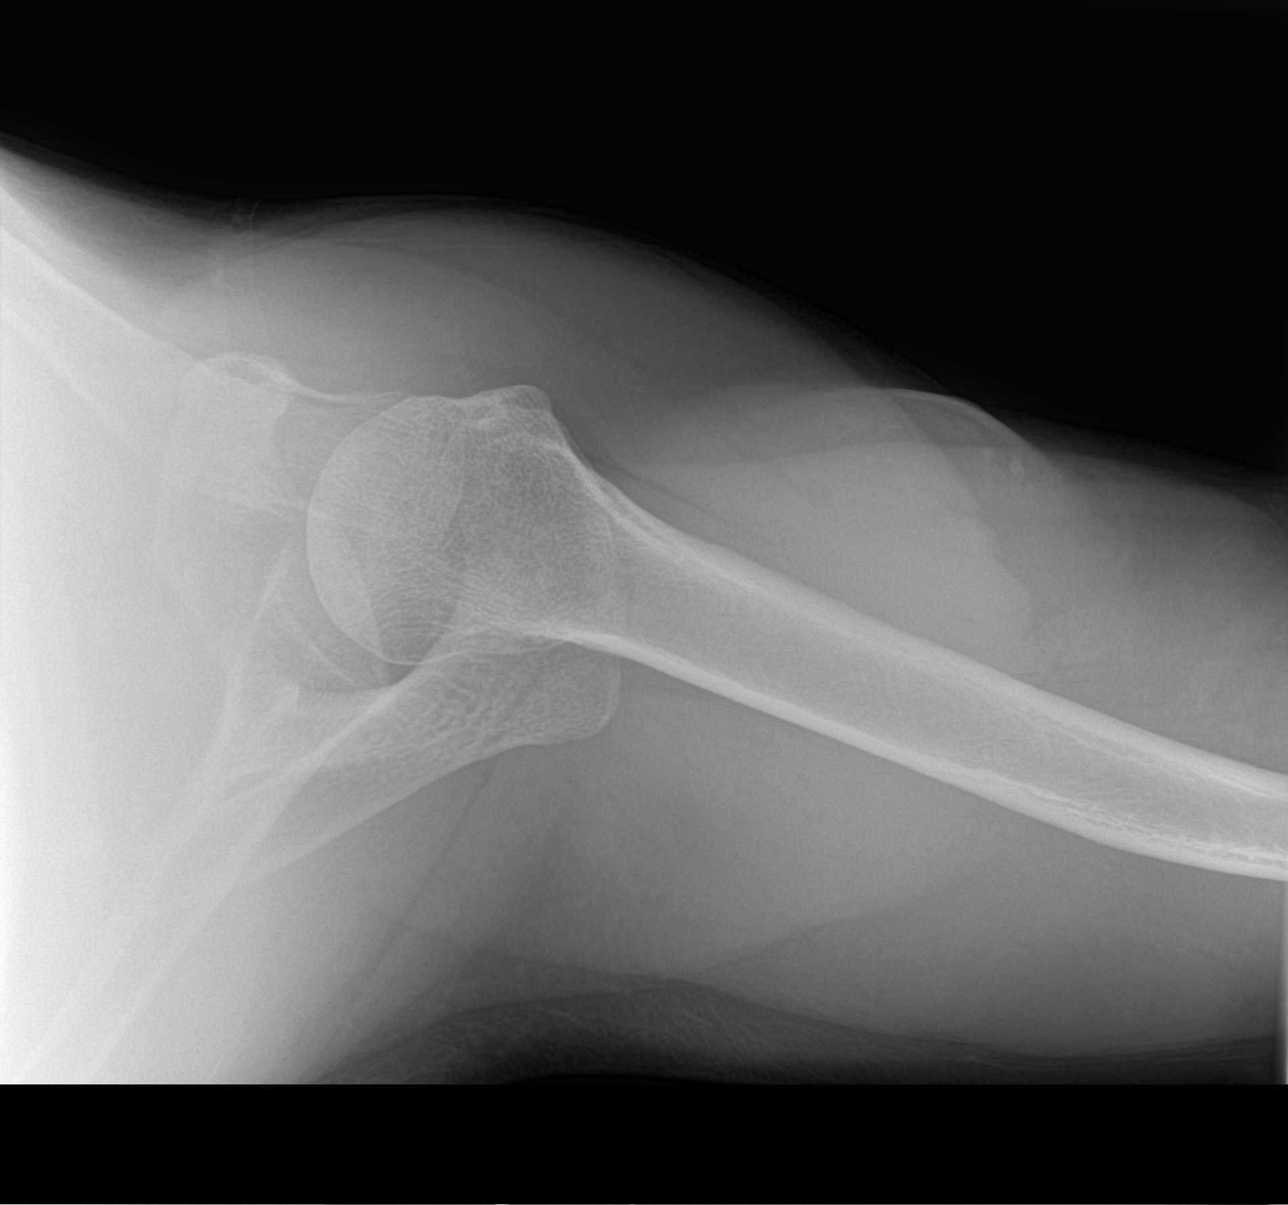

[3 of 3 positions shown; findings below may reference images not displayed]

FINDINGS: Osseous mineralization normal.

AC joint alignment normal.

Minimally displaced fracture distal third LEFT clavicle.

No additional fracture, dislocation or bone destruction.
IMPRESSION: Mildly displaced fracture at distal third LEFT clavicle.
